# Patient Record
Sex: Male | Born: 1995 | Race: Black or African American | Hispanic: No | Smoking: Never smoker
Health system: Southern US, Community
[De-identification: ages and names within clinical notes are randomized; demographics above are authoritative.]

---
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
## 2015-04-03 ENCOUNTER — Encounter (HOSPITAL_COMMUNITY): Payer: Self-pay | Admitting: Emergency Medicine

## 2015-04-03 ENCOUNTER — Emergency Department (HOSPITAL_COMMUNITY)
Admission: EM | Admit: 2015-04-03 | Discharge: 2015-04-03 | Disposition: A | Discharge status: Home / Self Care | Payer: Medicaid Other | Attending: Emergency Medicine | Admitting: Emergency Medicine

## 2015-04-03 DIAGNOSIS — L259 Unspecified contact dermatitis, unspecified cause: Secondary | ICD-10-CM

## 2015-04-03 DIAGNOSIS — Z791 Long term (current) use of non-steroidal anti-inflammatories (NSAID): Secondary | ICD-10-CM | POA: Insufficient documentation

## 2015-04-03 DIAGNOSIS — J029 Acute pharyngitis, unspecified: Secondary | ICD-10-CM | POA: Insufficient documentation

## 2015-04-03 DIAGNOSIS — Z79899 Other long term (current) drug therapy: Secondary | ICD-10-CM | POA: Insufficient documentation

## 2015-04-03 MED ORDER — PREDNISONE 10 MG PO TABS: ORAL_TABLET | ORAL | Status: DC

## 2015-04-03 NOTE — Discharge Instructions (Signed)

## 2015-04-03 NOTE — ED Notes (Signed)
Verbalized understanding discharge instructions. In no acute distress.   

## 2015-04-03 NOTE — ED Notes (Signed)
Pt states that he has had a diffuse rash on his arms, groin and neck x 2 days. States when he was younger he used to have allergies to new fabrics. Has new sheets.

## 2015-04-03 NOTE — ED Provider Notes (Signed)
CSN: [id number]     Arrival date & time 04/03/15  1749 History  This chart was scribed for non-physician practitioner, Teressa LowerVrinda Erian Rosengren, NP, working with Elwin MochaBlair Walden, MD by Evon Slackerrance Branch, ED Scribe. This patient was seen in room WTR7/WTR7 and the patient's care was started at 5:58 PM.     Chief Complaint  Patient presents with  . Rash   The history is provided by the patient. No language interpreter was used.   HPI Comments: Mark Roth is a 19 y.o. male who presents to the Emergency Department complaining of generalized rash located on his arms, groin and neck onset 2 days prior. Pt does report new job in [employer]. Pt does report slight sore throat. Pt states that he has tried calamine lotion with no relief. Pt does report that when he was younger he was allergic to fabrics, he states that he recently just got some new sheets for his bed. Denies fever, new soaps or medications.   History reviewed. No pertinent past medical history. History reviewed. No pertinent past surgical history. History reviewed. No pertinent family history. History  Substance Use Topics  . Smoking status: Never Smoker   . Smokeless tobacco: Not on file  . Alcohol Use: No    Review of Systems  Constitutional: Negative for fever.  HENT: Positive for sore throat.   Skin: Positive for rash.  All other systems reviewed and are negative.     Allergies  Review of patient's allergies indicates no known allergies.  Home Medications   Prior to Admission medications   Medication Sig Start Date End Date Taking? Authorizing Provider  ibuprofen (ADVIL,MOTRIN) 800 MG tablet Take 1 tablet (800 mg total) by mouth 3 (three) times daily. 04/09/14   April Palumbo, MD  methocarbamol (ROBAXIN) 500 MG tablet Take 1 tablet (500 mg total) by mouth 2 (two) times daily. 04/09/14   April Palumbo, MD   BP 128/74 mmHg  Pulse 68  Temp(Src) 98.7 F (37.1 C) (Oral)  SpO2 98%   Physical Exam  Constitutional: He is  oriented to person, place, and time. He appears well-developed and well-nourished. No distress.  HENT:  Head: Normocephalic and atraumatic.  Right Ear: External ear normal.  Left Ear: External ear normal.  Mouth/Throat: Oropharynx is clear and moist.  Eyes: Conjunctivae and EOM are normal. Pupils are equal, round, and reactive to light.  Neck: Neck supple. No tracheal deviation present.  Cardiovascular: Normal rate.   Pulmonary/Chest: Effort normal. No respiratory distress.  Musculoskeletal: Normal range of motion.  Neurological: He is alert and oriented to person, place, and time. He exhibits normal muscle tone. Coordination normal.  Skin:  Papular rash to to trunk neck and upper extremity  Psychiatric: He has a normal mood and affect. His behavior is normal.  Nursing note and vitals reviewed.   ED Course  Procedures (including critical care time) DIAGNOSTIC STUDIES: Oxygen Saturation is 98% on RA, normal by my interpretation.    COORDINATION OF CARE: 6:06 PM-Discussed treatment plan with pt at bedside and pt agreed to plan.     Labs Review Labs Reviewed - No data to display  Imaging Review No results found.   EKG Interpretation None      MDM   Final diagnoses:  Contact dermatitis    Pt given steroid dose pack. Considered scarlett fever although doubt. Discussed return precautions  I personally performed the services described in this documentation, which was scribed in my presence. The recorded information has been reviewed and  is accurate.      Teressa Lower, NP 04/03/15 1815  Elwin Mocha, MD 04/03/15 2330

## 2017-03-10 DIAGNOSIS — K353 Acute appendicitis with localized peritonitis: Principal | ICD-10-CM | POA: Insufficient documentation

## 2017-03-11 ENCOUNTER — Encounter (HOSPITAL_COMMUNITY)
Admission: EM | Disposition: A | Discharge status: Home / Self Care | Payer: Self-pay | Source: Home / Self Care | Attending: Emergency Medicine

## 2017-03-11 ENCOUNTER — Emergency Department (HOSPITAL_COMMUNITY): Payer: Self-pay | Admitting: Anesthesiology

## 2017-03-11 ENCOUNTER — Encounter (HOSPITAL_COMMUNITY): Payer: Self-pay

## 2017-03-11 ENCOUNTER — Emergency Department (HOSPITAL_COMMUNITY): Payer: Self-pay

## 2017-03-11 ENCOUNTER — Observation Stay (HOSPITAL_COMMUNITY)
Admission: EM | Admit: 2017-03-11 | Discharge: 2017-03-11 | Disposition: A | Discharge status: Home / Self Care | Payer: Self-pay | Attending: Surgery | Admitting: Surgery

## 2017-03-11 DIAGNOSIS — K3580 Unspecified acute appendicitis: Secondary | ICD-10-CM | POA: Diagnosis present

## 2017-03-11 DIAGNOSIS — K353 Acute appendicitis with localized peritonitis, without perforation or gangrene: Secondary | ICD-10-CM

## 2017-03-11 HISTORY — PX: LAPAROSCOPIC APPENDECTOMY: SHX408

## 2017-03-11 LAB — COMPREHENSIVE METABOLIC PANEL
ALT: 18 U/L (ref 17–63)
ANION GAP: 10 (ref 5–15)
AST: 24 U/L (ref 15–41)
Albumin: 4.8 g/dL (ref 3.5–5.0)
Alkaline Phosphatase: 85 U/L (ref 38–126)
BUN: 11 mg/dL (ref 6–20)
CHLORIDE: 102 mmol/L (ref 101–111)
CO2: 24 mmol/L (ref 22–32)
Calcium: 9.6 mg/dL (ref 8.9–10.3)
Creatinine, Ser: 1.01 mg/dL (ref 0.61–1.24)
GFR calc Af Amer: >60 mL/min (ref >60)
Glucose, Bld: 98 mg/dL (ref 65–99)
Potassium: 3.9 mmol/L (ref 3.5–5.1)
Sodium: 136 mmol/L (ref 135–145)
TOTAL PROTEIN: 7.8 g/dL (ref 6.5–8.1)
Total Bilirubin: 1.1 mg/dL (ref 0.3–1.2)

## 2017-03-11 LAB — LIPASE, BLOOD: Lipase: 20 U/L (ref 11–51)

## 2017-03-11 LAB — URINALYSIS, ROUTINE W REFLEX MICROSCOPIC
BILIRUBIN URINE: NEGATIVE
Glucose, UA: NEGATIVE mg/dL
HGB URINE DIPSTICK: NEGATIVE
KETONES UR: 5 mg/dL — AB
Leukocytes, UA: NEGATIVE
NITRITE: NEGATIVE
PROTEIN: NEGATIVE mg/dL
Specific Gravity, Urine: 1.024 (ref 1.005–1.030)
pH: 6 (ref 5.0–8.0)

## 2017-03-11 LAB — CBC
HEMATOCRIT: 45.1 % (ref 39.0–52.0)
HEMOGLOBIN: 15.4 g/dL (ref 13.0–17.0)
MCH: 27.3 pg (ref 26.0–34.0)
MCHC: 34.1 g/dL (ref 30.0–36.0)
MCV: 80 fL (ref 78.0–100.0)
Platelets: 246 10*3/uL (ref 150–400)
RBC: 5.64 MIL/uL (ref 4.22–5.81)
RDW: 12.8 % (ref 11.5–15.5)
WBC: 12.2 10*3/uL — AB (ref 4.0–10.5)

## 2017-03-11 SURGERY — APPENDECTOMY, LAPAROSCOPIC
Anesthesia: General | Site: Abdomen

## 2017-03-11 MED ORDER — ONDANSETRON HCL 4 MG/2ML IJ SOLN
INTRAMUSCULAR | Status: AC
  Filled 2017-03-11: qty 2

## 2017-03-11 MED ORDER — IOPAMIDOL (ISOVUE-300) INJECTION 61%
INTRAVENOUS | Status: AC
  Administered 2017-03-11: 100 mL via INTRAVENOUS
  Filled 2017-03-11: qty 100

## 2017-03-11 MED ORDER — FENTANYL CITRATE (PF) 100 MCG/2ML IJ SOLN
50.0000 ug | Freq: Once | INTRAMUSCULAR | Status: AC
  Administered 2017-03-11: 50 ug via INTRAVENOUS
  Filled 2017-03-11: qty 2

## 2017-03-11 MED ORDER — SUGAMMADEX SODIUM 200 MG/2ML IV SOLN
INTRAVENOUS | Status: DC | PRN
  Administered 2017-03-11: 200 mg via INTRAVENOUS

## 2017-03-11 MED ORDER — METRONIDAZOLE IN NACL 5-0.79 MG/ML-% IV SOLN
500.0000 mg | Freq: Once | INTRAVENOUS | Status: AC
  Administered 2017-03-11: 500 mg via INTRAVENOUS
  Filled 2017-03-11: qty 100

## 2017-03-11 MED ORDER — DEXTROSE 5 % IV SOLN
2.0000 g | INTRAVENOUS | Status: DC
  Filled 2017-03-11: qty 2

## 2017-03-11 MED ORDER — PROPOFOL 10 MG/ML IV BOLUS
INTRAVENOUS | Status: DC | PRN
  Administered 2017-03-11: 150 mg via INTRAVENOUS

## 2017-03-11 MED ORDER — ROCURONIUM BROMIDE 10 MG/ML (PF) SYRINGE
PREFILLED_SYRINGE | INTRAVENOUS | Status: DC | PRN
  Administered 2017-03-11: 50 mg via INTRAVENOUS

## 2017-03-11 MED ORDER — PROPOFOL 10 MG/ML IV BOLUS
INTRAVENOUS | Status: AC
  Filled 2017-03-11: qty 20

## 2017-03-11 MED ORDER — FENTANYL CITRATE (PF) 250 MCG/5ML IJ SOLN
INTRAMUSCULAR | Status: DC | PRN
  Administered 2017-03-11 (×2): 50 ug via INTRAVENOUS
  Administered 2017-03-11: 100 ug via INTRAVENOUS

## 2017-03-11 MED ORDER — SUGAMMADEX SODIUM 200 MG/2ML IV SOLN
INTRAVENOUS | Status: AC
  Filled 2017-03-11: qty 2

## 2017-03-11 MED ORDER — OXYCODONE HCL 5 MG/5ML PO SOLN: 5.0000 mg | Freq: Once | ORAL | Status: DC | PRN

## 2017-03-11 MED ORDER — LIDOCAINE 2% (20 MG/ML) 5 ML SYRINGE
INTRAMUSCULAR | Status: DC | PRN
  Administered 2017-03-11: 60 mg via INTRAVENOUS

## 2017-03-11 MED ORDER — HYDROMORPHONE HCL 1 MG/ML IJ SOLN
0.5000 mg | INTRAMUSCULAR | Status: DC | PRN
  Administered 2017-03-11: 1 mg via INTRAVENOUS
  Filled 2017-03-11: qty 1

## 2017-03-11 MED ORDER — FENTANYL CITRATE (PF) 250 MCG/5ML IJ SOLN
INTRAMUSCULAR | Status: AC
  Filled 2017-03-11: qty 5

## 2017-03-11 MED ORDER — METRONIDAZOLE IN NACL 5-0.79 MG/ML-% IV SOLN
500.0000 mg | Freq: Three times a day (TID) | INTRAVENOUS | Status: DC
  Administered 2017-03-11: 15:00:00 500 mg via INTRAVENOUS
  Filled 2017-03-11 (×3): qty 100

## 2017-03-11 MED ORDER — ONDANSETRON HCL 4 MG/2ML IJ SOLN
4.0000 mg | Freq: Four times a day (QID) | INTRAMUSCULAR | Status: DC | PRN
  Administered 2017-03-11: 4 mg via INTRAVENOUS
  Filled 2017-03-11: qty 2

## 2017-03-11 MED ORDER — CEFTRIAXONE SODIUM 2 G IJ SOLR
2.0000 g | Freq: Once | INTRAMUSCULAR | Status: AC
  Administered 2017-03-11: 2 g via INTRAVENOUS
  Filled 2017-03-11: qty 2

## 2017-03-11 MED ORDER — LACTATED RINGERS IV SOLN
INTRAVENOUS | Status: DC | PRN
  Administered 2017-03-11 (×2): via INTRAVENOUS

## 2017-03-11 MED ORDER — HYDROCODONE-ACETAMINOPHEN 5-325 MG PO TABS
1.0000 | ORAL_TABLET | ORAL | Status: DC | PRN
  Administered 2017-03-11: 19:00:00 1 via ORAL
  Filled 2017-03-11: qty 1

## 2017-03-11 MED ORDER — LACTATED RINGERS IR SOLN
Status: DC | PRN
  Administered 2017-03-11: 1000 mL

## 2017-03-11 MED ORDER — ONDANSETRON HCL 4 MG/2ML IJ SOLN
INTRAMUSCULAR | Status: DC | PRN
  Administered 2017-03-11: 4 mg via INTRAVENOUS

## 2017-03-11 MED ORDER — ONDANSETRON HCL 4 MG/2ML IJ SOLN: 4.0000 mg | Freq: Four times a day (QID) | INTRAMUSCULAR | Status: DC | PRN

## 2017-03-11 MED ORDER — MIDAZOLAM HCL 2 MG/2ML IJ SOLN
INTRAMUSCULAR | Status: AC
Start: 2017-03-11 — End: 2017-03-11
  Filled 2017-03-11: qty 2

## 2017-03-11 MED ORDER — ONDANSETRON 4 MG PO TBDP: 4.0000 mg | ORAL_TABLET | Freq: Four times a day (QID) | ORAL | Status: DC | PRN

## 2017-03-11 MED ORDER — DEXAMETHASONE SODIUM PHOSPHATE 10 MG/ML IJ SOLN
INTRAMUSCULAR | Status: AC
  Filled 2017-03-11: qty 1

## 2017-03-11 MED ORDER — KCL IN DEXTROSE-NACL 20-5-0.45 MEQ/L-%-% IV SOLN
INTRAVENOUS | Status: DC
  Administered 2017-03-11: 100 mL/h via INTRAVENOUS
  Filled 2017-03-11 (×2): qty 1000

## 2017-03-11 MED ORDER — PROMETHAZINE HCL 25 MG/ML IJ SOLN: 6.2500 mg | INTRAMUSCULAR | Status: DC | PRN

## 2017-03-11 MED ORDER — KETOROLAC TROMETHAMINE 30 MG/ML IJ SOLN
INTRAMUSCULAR | Status: AC
  Filled 2017-03-11: qty 1

## 2017-03-11 MED ORDER — LIDOCAINE 2% (20 MG/ML) 5 ML SYRINGE
INTRAMUSCULAR | Status: AC
  Filled 2017-03-11: qty 5

## 2017-03-11 MED ORDER — OXYCODONE HCL 5 MG PO TABS: 5.0000 mg | ORAL_TABLET | Freq: Once | ORAL | Status: DC | PRN

## 2017-03-11 MED ORDER — 0.9 % SODIUM CHLORIDE (POUR BTL) OPTIME
TOPICAL | Status: DC | PRN
  Administered 2017-03-11: 1000 mL

## 2017-03-11 MED ORDER — HYDROCODONE-ACETAMINOPHEN 5-325 MG PO TABS: 1.0000 | ORAL_TABLET | ORAL | 0 refills | Status: AC | PRN

## 2017-03-11 MED ORDER — IBUPROFEN 400 MG PO TABS
400.0000 mg | ORAL_TABLET | Freq: Four times a day (QID) | ORAL | Status: DC | PRN
  Administered 2017-03-11: 19:00:00 400 mg via ORAL
  Filled 2017-03-11: qty 1

## 2017-03-11 MED ORDER — BUPIVACAINE-EPINEPHRINE 0.25% -1:200000 IJ SOLN
INTRAMUSCULAR | Status: DC | PRN
  Administered 2017-03-11: 30 mL

## 2017-03-11 MED ORDER — ROCURONIUM BROMIDE 50 MG/5ML IV SOSY
PREFILLED_SYRINGE | INTRAVENOUS | Status: AC
  Filled 2017-03-11: qty 5

## 2017-03-11 MED ORDER — ACETAMINOPHEN 325 MG PO TABS: ORAL_TABLET | ORAL | 2 refills | Status: AC

## 2017-03-11 MED ORDER — FENTANYL CITRATE (PF) 100 MCG/2ML IJ SOLN: 25.0000 ug | INTRAMUSCULAR | Status: DC | PRN

## 2017-03-11 MED ORDER — CEFTRIAXONE SODIUM 2 G IJ SOLR: 2.0000 g | Freq: Once | INTRAMUSCULAR | Status: DC

## 2017-03-11 MED ORDER — METRONIDAZOLE IN NACL 5-0.79 MG/ML-% IV SOLN: 500.0000 mg | Freq: Once | INTRAVENOUS | Status: DC

## 2017-03-11 MED ORDER — KETOROLAC TROMETHAMINE 30 MG/ML IJ SOLN
30.0000 mg | Freq: Once | INTRAMUSCULAR | Status: DC | PRN
  Administered 2017-03-11: 30 mg via INTRAVENOUS

## 2017-03-11 MED ORDER — MORPHINE SULFATE (PF) 4 MG/ML IV SOLN: 1.0000 mg | INTRAVENOUS | Status: DC | PRN

## 2017-03-11 MED ORDER — ONDANSETRON HCL 4 MG/2ML IJ SOLN
4.0000 mg | Freq: Once | INTRAMUSCULAR | Status: AC
  Administered 2017-03-11: 4 mg via INTRAVENOUS
  Filled 2017-03-11: qty 2

## 2017-03-11 MED ORDER — MIDAZOLAM HCL 5 MG/5ML IJ SOLN
INTRAMUSCULAR | Status: DC | PRN
  Administered 2017-03-11: 2 mg via INTRAVENOUS

## 2017-03-11 MED ORDER — DEXAMETHASONE SODIUM PHOSPHATE 10 MG/ML IJ SOLN
INTRAMUSCULAR | Status: DC | PRN
  Administered 2017-03-11: 10 mg via INTRAVENOUS

## 2017-03-11 MED ORDER — IOPAMIDOL (ISOVUE-300) INJECTION 61%
100.0000 mL | Freq: Once | INTRAVENOUS | Status: AC | PRN
  Administered 2017-03-11: 100 mL via INTRAVENOUS

## 2017-03-11 MED ORDER — HEPARIN SODIUM (PORCINE) 5000 UNIT/ML IJ SOLN: 5000.0000 [IU] | Freq: Three times a day (TID) | INTRAMUSCULAR | Status: DC

## 2017-03-11 MED ORDER — BUPIVACAINE-EPINEPHRINE (PF) 0.25% -1:200000 IJ SOLN
INTRAMUSCULAR | Status: AC
  Filled 2017-03-11: qty 30

## 2017-03-11 SURGICAL SUPPLY — 39 items
ADH SKN CLS APL DERMABOND .7 (GAUZE/BANDAGES/DRESSINGS) ×1
APL SKNCLS STERI-STRIP NONHPOA (GAUZE/BANDAGES/DRESSINGS)
APPLIER CLIP ROT 10 11.4 M/L (STAPLE)
APR CLP MED LRG 11.4X10 (STAPLE)
BAG SPEC RTRVL LRG 6X4 10 (ENDOMECHANICALS) ×1
BENZOIN TINCTURE PRP APPL 2/3 (GAUZE/BANDAGES/DRESSINGS) IMPLANT
CABLE HIGH FREQUENCY MONO STRZ (ELECTRODE) ×2 IMPLANT
CHLORAPREP W/TINT 26ML (MISCELLANEOUS) ×2 IMPLANT
CLIP APPLIE ROT 10 11.4 M/L (STAPLE) IMPLANT
COVER SURGICAL LIGHT HANDLE (MISCELLANEOUS) ×2 IMPLANT
CUTTER FLEX LINEAR 45M (STAPLE) ×1 IMPLANT
DECANTER SPIKE VIAL GLASS SM (MISCELLANEOUS) ×2 IMPLANT
DERMABOND ADVANCED (GAUZE/BANDAGES/DRESSINGS) ×1
DERMABOND ADVANCED .7 DNX12 (GAUZE/BANDAGES/DRESSINGS) ×1 IMPLANT
DRAPE LAPAROSCOPIC ABDOMINAL (DRAPES) ×2 IMPLANT
ELECT REM PT RETURN 15FT ADLT (MISCELLANEOUS) ×2 IMPLANT
ENDOLOOP SUT PDS II  0 18 (SUTURE)
ENDOLOOP SUT PDS II 0 18 (SUTURE) IMPLANT
GLOVE SURG SIGNA 7.5 PF LTX (GLOVE) ×2 IMPLANT
GOWN STRL REUS W/TWL XL LVL3 (GOWN DISPOSABLE) ×4 IMPLANT
IRRIG SUCT STRYKERFLOW 2 WTIP (MISCELLANEOUS) ×2
IRRIGATION SUCT STRKRFLW 2 WTP (MISCELLANEOUS) ×1 IMPLANT
KIT BASIN OR (CUSTOM PROCEDURE TRAY) ×2 IMPLANT
POUCH SPECIMEN RETRIEVAL 10MM (ENDOMECHANICALS) ×2 IMPLANT
RELOAD 45 VASCULAR/THIN (ENDOMECHANICALS) IMPLANT
RELOAD STAPLE 45 2.5 WHT GRN (ENDOMECHANICALS) IMPLANT
RELOAD STAPLE 45 3.5 BLU ETS (ENDOMECHANICALS) IMPLANT
RELOAD STAPLE TA45 3.5 REG BLU (ENDOMECHANICALS) ×2 IMPLANT
SCISSORS LAP 5X35 DISP (ENDOMECHANICALS) ×2 IMPLANT
SHEARS HARMONIC ACE PLUS 36CM (ENDOMECHANICALS) ×2 IMPLANT
SLEEVE XCEL OPT CAN 5 100 (ENDOMECHANICALS) ×2 IMPLANT
STRIP CLOSURE SKIN 1/2X4 (GAUZE/BANDAGES/DRESSINGS) IMPLANT
SUT MNCRL AB 4-0 PS2 18 (SUTURE) ×2 IMPLANT
SUT VIC AB 2-0 SH 18 (SUTURE) IMPLANT
TOWEL OR 17X26 10 PK STRL BLUE (TOWEL DISPOSABLE) ×2 IMPLANT
TOWEL OR NON WOVEN STRL DISP B (DISPOSABLE) ×2 IMPLANT
TRAY LAPAROSCOPIC (CUSTOM PROCEDURE TRAY) ×2 IMPLANT
TROCAR BLADELESS OPT 5 100 (ENDOMECHANICALS) ×2 IMPLANT
TROCAR XCEL BLUNT TIP 100MML (ENDOMECHANICALS) ×2 IMPLANT

## 2017-03-11 NOTE — H&P (Signed)
[facility] Admission Note  Mark Roth 1996/08/22  [id number].    Requesting MD: Ripley Fraise Chief Complaint/Reason for Consult: Acute Appendicitis HPI:  Patient is a 21 y.o. Male who presented to [facility] with lower abdominal pain since Sunday night. Pain has been constant and stabbing in nature, localized to the RLQ yesterday. Associated nausea. Denies vomiting, diarrhea, fever, difficulty urinating. Pain is exacerbated with movement, nothing alleviates. No significant PMH, takes no daily medications. Patient has not had any past surgeries. Denies tobacco use, alcohol use, any illicit drug use.   ROS: Review of Systems  Constitutional: Negative for chills and fever.  Respiratory: Negative for shortness of breath and wheezing.   Cardiovascular: Negative for chest pain and palpitations.  Gastrointestinal: Positive for abdominal pain and nausea. Negative for blood in stool, diarrhea and vomiting.  Genitourinary: Negative for dysuria and flank pain.  All other systems reviewed and are negative.   History reviewed. No pertinent family history.  History reviewed. No pertinent past medical history.  History reviewed. No pertinent surgical history.  Social History:  reports that he has never smoked. He has never used smokeless tobacco. He reports that he does not drink alcohol or use drugs.  Allergies: No Known Allergies   (Not in a hospital admission)  Blood pressure 114/63, pulse 67, temperature 98.7 F (37.1 C), temperature source Oral, resp. rate 18, height 6' (1.829 m), weight 72.6 kg (160 lb), SpO2 98 %. Physical Exam: Physical Exam  Constitutional: He is oriented to person, place, and time. He appears well-developed and well-nourished. He is cooperative.  Non-toxic appearance. No distress.  HENT:  Head: Normocephalic and atraumatic.  Right Ear: Hearing and external ear normal.  Left Ear: Hearing and external ear normal.  Nose: Nose normal.  Mouth/Throat:  Oropharynx is clear and moist and mucous membranes are normal.  Eyes: Conjunctivae, EOM and lids are normal. Pupils are equal, round, and reactive to light. No scleral icterus.  Neck: Trachea normal, normal range of motion and phonation normal. Neck supple.  Cardiovascular: Normal rate, regular rhythm, S1 normal and S2 normal.   Pulses:      Radial pulses are 2+ on the right side, and 2+ on the left side.       Dorsalis pedis pulses are 2+ on the right side, and 2+ on the left side.  Pulmonary/Chest: Effort normal and breath sounds normal. He has no decreased breath sounds. He has no wheezes. He has no rhonchi. He has no rales.  Abdominal: Soft. Normal appearance. He exhibits no distension. Bowel sounds are decreased. There is tenderness in the right lower quadrant. There is rebound and tenderness at McBurney's point. There is no rigidity and no guarding. No hernia.  Musculoskeletal:  No gross deformities, no edema. Patient moving all extremities.   Neurological: He is alert and oriented to person, place, and time. He has normal strength. No sensory deficit.  Skin: Skin is warm, dry and intact. No rash noted. He is not diaphoretic.  Psychiatric: He has a normal mood and affect. His behavior is normal. Judgment and thought content normal.    Results for orders placed or performed during the hospital encounter of 03/11/17 (from the past 48 hour(s))  Urinalysis, Routine w reflex microscopic     Status: Abnormal   Collection Time: 03/11/17  1:14 AM  Result Value Ref Range   Color, Urine YELLOW YELLOW   APPearance CLEAR CLEAR   Specific Gravity, Urine 1.024 1.005 - 1.030   pH 6.0  5.0 - 8.0   Glucose, UA NEGATIVE NEGATIVE mg/dL   Hgb urine dipstick NEGATIVE NEGATIVE   Bilirubin Urine NEGATIVE NEGATIVE   Ketones, ur 5 (A) NEGATIVE mg/dL   Protein, ur NEGATIVE NEGATIVE mg/dL   Nitrite NEGATIVE NEGATIVE   Leukocytes, UA NEGATIVE NEGATIVE  Lipase, blood     Status: None   Collection Time:  03/11/17  1:38 AM  Result Value Ref Range   Lipase 20 11 - 51 U/L  Comprehensive metabolic panel     Status: None   Collection Time: 03/11/17  1:38 AM  Result Value Ref Range   Sodium 136 135 - 145 mmol/L   Potassium 3.9 3.5 - 5.1 mmol/L   Chloride 102 101 - 111 mmol/L   CO2 24 22 - 32 mmol/L   Glucose, Bld 98 65 - 99 mg/dL   BUN 11 6 - 20 mg/dL   Creatinine, Ser 1.01 0.61 - 1.24 mg/dL   Calcium 9.6 8.9 - 10.3 mg/dL   Total Protein 7.8 6.5 - 8.1 g/dL   Albumin 4.8 3.5 - 5.0 g/dL   AST 24 15 - 41 U/L   ALT 18 17 - 63 U/L   Alkaline Phosphatase 85 38 - 126 U/L   Total Bilirubin 1.1 0.3 - 1.2 mg/dL   GFR calc non Af Amer >60 >60 mL/min   GFR calc Af Amer >60 >60 mL/min    Comment: (NOTE) The eGFR has been calculated using the CKD EPI equation. This calculation has not been validated in all clinical situations. eGFR's persistently <60 mL/min signify possible Chronic Kidney Disease.    Anion gap 10 5 - 15  CBC     Status: Abnormal   Collection Time: 03/11/17  1:38 AM  Result Value Ref Range   WBC 12.2 (H) 4.0 - 10.5 K/uL   RBC 5.64 4.22 - 5.81 MIL/uL   Hemoglobin 15.4 13.0 - 17.0 g/dL   HCT 45.1 39.0 - 52.0 %   MCV 80.0 78.0 - 100.0 fL   MCH 27.3 26.0 - 34.0 pg   MCHC 34.1 30.0 - 36.0 g/dL   RDW 12.8 11.5 - 15.5 %   Platelets 246 150 - 400 K/uL   Ct Abdomen Pelvis W Contrast  Result Date: 03/11/2017 CLINICAL DATA:  Right lower quadrant pain for 1 day. EXAM: CT ABDOMEN AND PELVIS WITH CONTRAST TECHNIQUE: Multidetector CT imaging of the abdomen and pelvis was performed using the standard protocol following bolus administration of intravenous contrast. CONTRAST:  100 cc Isovue-300 IV COMPARISON:  None. FINDINGS: Lower chest: The lung bases are clear. Hepatobiliary: No focal liver abnormality is seen. No gallstones, gallbladder wall thickening, or biliary dilatation. Pancreas: No ductal dilatation or inflammation. Spleen: Normal in size without focal abnormality. Adrenals/Urinary  Tract: Adrenal glands are unremarkable. Kidneys are normal, without renal calculi, focal lesion, or hydronephrosis. Bladder is unremarkable. Stomach/Bowel: The appendix is dilated measuring 13 mm with wall thickening, peripheral enhancement sign intraluminal fluid. Moderate surrounding periappendiceal soft tissue stranding and mild free fluid. No perforation or abscess. Minimal wall thickening of pelvic bowel loops likely reactive. Stomach is physiologically distended. Vascular/Lymphatic: No significant vascular findings are present. No enlarged abdominal or pelvic lymph nodes. Reproductive: Prostate is unremarkable. Other: Small-moderate volume free fluid in the pelvis is minimally complex, likely reactive. No loculated abscess. Musculoskeletal: There are no acute or suspicious osseous abnormalities. IMPRESSION: Uncomplicated acute appendicitis. Small to moderate free fluid in the pelvis is likely reactive. No loculated abscess or perforation. Electronically Signed  By: Jeb Levering M.D.   On: 03/11/2017 05:28      Assessment/Plan Acute Appendicitis - to OR for laparoscopic appendectomy with Dr. Lucia Gaskins - consent patient - IV rocephin and IV flagyl  - NPO, IVF, pain control, antiemetics PRN for nausea - admit to floor post-op   Brigid Re, [facility] Surgery 03/11/2017, 7:20 AM Pager: [phone] Consults: [phone] Mon-Fri 7:00 am-4:30 pm Sat-Sun 7:00 am-11:30 am  Agree with above. Girlfriend, Ky Barban, at bedside.  His mother is going to come to the hospital.  I discussed with the patient the indications and risks of appendiceal surgery.  The primary risks of appendiceal surgery include, but are not limited to, bleeding, infection, bowel surgery, and open surgery.  There is also the risk that the patient may have continued symptoms after surgery.  We discussed the typical post-operative recovery course. I tried to answer the patient's questions.  Will proceed  with lap appendectomy later this AM.  Alphonsa Overall, MD, [facility] Surgery Pager: [phone] Office phone:  [phone]

## 2017-03-11 NOTE — Anesthesia Postprocedure Evaluation (Signed)
Anesthesia Post Note  Patient: Mark Roth  Procedure(s) Performed: Procedure(s) (LRB): APPENDECTOMY LAPAROSCOPIC (N/A)     Patient location during evaluation: PACU Anesthesia Type: General Level of consciousness: awake and alert Pain management: pain level controlled Vital Signs Assessment: post-procedure vital signs reviewed and stable Respiratory status: spontaneous breathing, nonlabored ventilation, respiratory function stable and patient connected to nasal cannula oxygen Cardiovascular status: stable and blood pressure returned to baseline Postop Assessment: no signs of nausea or vomiting Anesthetic complications: no    Last Vitals:  Vitals:   03/11/17 1100 03/11/17 1115  BP: 138/66 125/61  Pulse: 82 78  Resp: 14 12  Temp:  37.1 C    Last Pain:  Vitals:   03/11/17 1115  TempSrc:   PainSc: Asleep                 Corde Antonini S

## 2017-03-11 NOTE — Transfer of Care (Signed)
Immediate Anesthesia Transfer of Care Note  Patient: Mark Roth  Procedure(s) Performed: Procedure(s): APPENDECTOMY LAPAROSCOPIC (N/A)  Patient Location: PACU  Anesthesia Type:General  Level of Consciousness:  sedated, patient cooperative and responds to stimulation  Airway & Oxygen Therapy:Patient Spontanous Breathing and Patient connected to face mask oxgen  Post-op Assessment:  Report given to PACU RN and Post -op Vital signs reviewed and stable  Post vital signs:  Reviewed and stable  Last Vitals:  Vitals:   03/11/17 0533 03/11/17 0641  BP: 125/66 114/63  Pulse: 71 67  Resp: 18 18  Temp:  37.1 C    Complications: No apparent anesthesia complications

## 2017-03-11 NOTE — Anesthesia Preprocedure Evaluation (Signed)
Anesthesia Evaluation  Patient identified by MRN, date of birth, ID band Patient awake    Reviewed: Allergy & Precautions, NPO status , Patient's Chart, lab work & pertinent test results  Airway Mallampati: II  TM Distance: >3 FB Neck ROM: Full    Dental no notable dental hx.    Pulmonary neg pulmonary ROS,    Pulmonary exam normal breath sounds clear to auscultation       Cardiovascular negative cardio ROS Normal cardiovascular exam Rhythm:Regular Rate:Normal     Neuro/Psych negative neurological ROS  negative psych ROS   GI/Hepatic negative GI ROS, Neg liver ROS,   Endo/Other  negative endocrine ROS  Renal/GU negative Renal ROS  negative genitourinary   Musculoskeletal negative musculoskeletal ROS (+)   Abdominal   Peds negative pediatric ROS (+)  Hematology negative hematology ROS (+)   Anesthesia Other Findings   Reproductive/Obstetrics negative OB ROS                             Anesthesia Physical Anesthesia Plan  ASA: I  Anesthesia Plan: General   Post-op Pain Management:    Induction: Intravenous  PONV Risk Score and Plan: 1 and Ondansetron and Dexamethasone  Airway Management Planned: Oral ETT  Additional Equipment:   Intra-op Plan:   Post-operative Plan: Extubation in OR  Informed Consent: I have reviewed the patients History and Physical, chart, labs and discussed the procedure including the risks, benefits and alternatives for the proposed anesthesia with the patient or authorized representative who has indicated his/her understanding and acceptance.     Dental advisory given  Plan Discussed with: CRNA and Surgeon  Anesthesia Plan Comments:         Anesthesia Quick Evaluation  

## 2017-03-11 NOTE — Op Note (Signed)
Re:   Mark Roth DOB:   [birth year] MRN:   [id number]                   FACILITY:  [facility]  DATE OF PROCEDURE: 03/11/2017                              OPERATIVE REPORT  PREOPERATIVE DIAGNOSIS:  Appendicitis  POSTOPERATIVE DIAGNOSIS:  Acute purulent appendicitis.  PROCEDURE:  Laparoscopic appendectomy.  SURGEON:  Sandria Bales. Ezzard Standing, MD  ASSISTANT:  No first assistant.  ANESTHESIA:  General endotracheal.  Anesthesiologist: Eilene Ghazi, MD CRNA: Jhonnie Garner, CRNA  ASA:  1E  ESTIMATED BLOOD LOSS:  Minimal.  DRAINS: none   SPECIMEN:   Appendix  COUNTS CORRECT:  YES  INDICATIONS FOR PROCEDURE: Mark Roth is a 21 y.o. (DOB: 02-Jun-1996) AA male whose primary care doctor is Patient, No Pcp Per and comes to the OR for an appendectomy.   I discussed with the patient, the indications and potential complications of appendiceal surgery.  The potential complications include, but are not limited to, bleeding, open surgery, bowel resection, and the possibility of another diagnosis.  OPERATIVE NOTE:  The patient underwent a general endotracheal anesthetic as supervised by Anesthesiologist: Eilene Ghazi, MD CRNA: Jhonnie Garner, CRNA, General, in room #4 at [facility]  The patient was given Rocephin and Flagyl prior to the beginning of the procedure and the abdomen was prepped with ChloraPrep.   A time-out was held and surgical checklist run.  An infraumbilical incision was made with sharp dissection carried down to the abdominal cavity.  An 12 mm Hasson trocar was inserted through the infraumbilical incision and into the peritoneal cavity.  A 0 degree 5 mm laparoscope was inserted through a 12 mm Hasson trocar and the Hasson trocar secured with a 0 Vicryl suture.  I placed a 5 mm trocar in the right upper quadrant and 5 mm torcar in left lower quadrant and did abdominal exploration.    The right and left lobes of liver unremarkable.  Stomach was unremarkable.  The pelvic organs  were unremarkable.  I saw no other intra-abdominal abnormality.  The patient had appendicitis with the appendix located lateral to the cecum and right colon.  The appendix was covered under a veil of peritoneum and, at the tip, was bulbous and covered with pus..  The mesentery of the appendix was divided with a Harmonic scalpel.  I got to the base of the appendix.  I then used a blue load 45 mm Ethicon Endo-GIA stapler and fired this across the base of the appendix.  I placed the appendix in EndoCatch bag and delivered the bag through the umbilical incision.  I irrigated the abdomen with 1,000 cc of saline.  After irrigating the abdomen, I then removed the trocars, in turn.  The umbilical port fascia was closed with 0 Vicryl suture.   I closed the skin each site with a 4-0 Monocryl suture and painted the wounds with DermaBond.  I then injected a total of 30 mL of 0.25% Marcaine at the incisions.  Sponge and needle count were correct at the end of the case.  The patient was transferred to the recovery room in good condition.  The patient tolerated the procedure well and it depends on the patient's post op clinical course as to when the patient could be discharged.   Ovidio Kin, MD, [facility]  [place] [facility] Pager: [phone] Office phone:  [phone]

## 2017-03-11 NOTE — Discharge Instructions (Signed)
Laparoscopic Appendectomy, Adult, Care After °These instructions give you information about caring for yourself after your procedure. Your doctor may also give you more specific instructions. Call your doctor if you have any problems or questions after your procedure. °Follow these instructions at home: °Medicines °· Take over-the-counter and prescription medicines only as told by your doctor. °· Do not drive for 24 hours if you received a sedative. °· Do not drive or use heavy machinery while taking prescription pain medicine. °· If you were prescribed an antibiotic medicine, take it as told by your doctor. Do not stop taking it even if you start to feel better. °Activity °· Do not lift anything that is heavier than 10 pounds (4.5 kg) for 3 weeks or as told by your doctor. °· Do not play contact sports for 3 weeks or as told by your doctor. °· Slowly return to your normal activities. °Bathing °· Keep your cuts from surgery (incisions) clean and dry. °? Gently wash the cuts with soap and water. °? Rinse the cuts with water until the soap is gone. °? Pat the cuts dry with a clean towel. Do not rub the cuts. °· You may take showers after 48 hours. °· Do not take baths, swim, or use a hot tub for 2 weeks or as told by your doctor. °Cut Care °· Follow instructions from your doctor about how to take care of your cuts. Make sure you: °? Wash your hands with soap and water before you change your bandage (dressing). If you do not have soap and water, use hand sanitizer. °? Change your bandage as told by your doctor. °? Leave stitches (sutures), skin glue, or skin tape (adhesive) strips in place. They may need to stay in place for 2 weeks or longer. If tape strips get loose and curl up, you may trim the loose edges. Do not remove tape strips completely unless your doctor says it is okay. °· Check your cuts every day for signs of infection. Check for: °? More redness, swelling, or pain. °? More fluid or  blood. °? Warmth. °? Pus or a bad smell. °Other Instructions °· If you were sent home with a drain, follow instructions from your doctor about how to use it and care for it. °· Take deep breaths. This helps to keep your lungs from getting swollen (inflamed). °· To help with constipation: °? Drink plenty of fluids. °? Eat plenty of fruits and vegetables. °· Keep all follow-up visits as told by your doctor. This is important. °Contact a doctor if: °· You have more redness, swelling, or pain around a cut from surgery. °· You have more fluid or blood coming from a cut. °· Your cut feels warm to the touch. °· You have pus or a bad smell coming from a cut or a bandage. °· The edges of a cut break open after the stitches have been taken out. °· You have pain in your shoulders that gets worse. °· You feel dizzy or you pass out (faint). °· You have shortness of breath. °· You keep feeling sick to your stomach (nauseous). °· You keep throwing up (vomiting). °· You get diarrhea or you cannot control your poop. °· You lose your appetite. °· You have swelling or pain in your legs. °Get help right away if: °· You have a fever. °· You get a rash. °· You have trouble breathing. °· You have sharp pains in your chest. °This information is not intended to replace advice given   to you by your health care provider. Make sure you discuss any questions you have with your health care provider. °Document Released: 07/06/2009 Document Revised: 02/15/2016 Document Reviewed: 02/27/2015 °Elsevier Interactive Patient Education © 2018 [employer] ° °CCS ______[facility] °LAPAROSCOPIC SURGERY: POST OP INSTRUCTIONS °Always review your discharge instruction sheet given to you by the facility where your surgery was performed. °IF YOU HAVE DISABILITY OR FAMILY LEAVE FORMS, YOU MUST BRING THEM TO THE OFFICE FOR PROCESSING.   °DO NOT GIVE THEM TO YOUR DOCTOR. ° °1. A prescription for pain medication may be given to you upon  discharge.  Take your pain medication as prescribed, if needed.  If narcotic pain medicine is not needed, then you may take acetaminophen (Tylenol) or ibuprofen (Advil) as needed. °2. Take your usually prescribed medications unless otherwise directed. °3. If you need a refill on your pain medication, please contact your pharmacy.  They will contact our office to request authorization. Prescriptions will not be filled after 5pm or on week-ends. °4. You should follow a light diet the first few days after arrival home, such as soup and crackers, etc.  Be sure to include lots of fluids daily. °5. Most patients will experience some swelling and bruising in the area of the incisions.  Ice packs will help.  Swelling and bruising can take several days to resolve.  °6. It is common to experience some constipation if taking pain medication after surgery.  Increasing fluid intake and taking a stool softener (such as Colace) will usually help or prevent this problem from occurring.  A mild laxative (Milk of Magnesia or Miralax) should be taken according to package instructions if there are no bowel movements after 48 hours. °7. Unless discharge instructions indicate otherwise, you may remove your bandages 24-48 hours after surgery, and you may shower at that time.  You may have steri-strips (small skin tapes) in place directly over the incision.  These strips should be left on the skin for 7-10 days.  If your surgeon used skin glue on the incision, you may shower in 24 hours.  The glue will flake off over the next 2-3 weeks.  Any sutures or staples will be removed at the office during your follow-up visit. °8. ACTIVITIES:  You may resume regular (light) daily activities beginning the next day--such as daily self-care, walking, climbing stairs--gradually increasing activities as tolerated.  You may have sexual intercourse when it is comfortable.  Refrain from any heavy lifting or straining until approved by your doctor. °a. You  may drive when you are no longer taking prescription pain medication, you can comfortably wear a seatbelt, and you can safely maneuver your car and apply brakes. °b. RETURN TO WORK:  __________________________________________________________ °9. You should see your doctor in the office for a follow-up appointment approximately 2-3 weeks after your surgery.  Make sure that you call for this appointment within a day or two after you arrive home to insure a convenient appointment time. °10. OTHER INSTRUCTIONS: __________________________________________________________________________________________________________________________ __________________________________________________________________________________________________________________________ °WHEN TO CALL YOUR DOCTOR: °1. Fever over 101.0 °2. Inability to urinate °3. Continued bleeding from incision. °4. Increased pain, redness, or drainage from the incision. °5. Increasing abdominal pain ° °The clinic staff is available to answer your questions during regular business hours.  Please don’t hesitate to call and ask to speak to one of the nurses for clinical concerns.  If you have a medical emergency, go to the nearest emergency room or call 911.  A surgeon from   [facility] is always on call at the hospital. °[address], [address], [place], [place]  [address] ? [address] [address], [place], [place]   [address] °[phone] ? [phone] ? FAX [phone] °Web site: [email or web address] ° °

## 2017-03-11 NOTE — ED Provider Notes (Signed)
[facility]-EMERGENCY DEPT Provider Note   CSN: [id number] Arrival date & time: 03/10/17  2358     History   Chief Complaint Chief Complaint  Patient presents with  . Abdominal Pain    HPI Tiquan B Crail is a 21 y.o. male.  The history is provided by the patient.  Abdominal Pain   This is a new problem. The current episode started 12 to 24 hours ago. The problem occurs constantly. The problem has been gradually worsening. The pain is located in the RLQ. The pain is severe. Associated symptoms include nausea. Pertinent negatives include fever, diarrhea, vomiting and dysuria. The symptoms are aggravated by certain positions and palpation. Nothing relieves the symptoms.     PMH -none Home Medications    Prior to Admission medications   Medication Sig Start Date End Date Taking? Authorizing Provider  ibuprofen (ADVIL,MOTRIN) 800 MG tablet Take 1 tablet (800 mg total) by mouth 3 (three) times daily. 04/09/14   Palumbo, April, MD  methocarbamol (ROBAXIN) 500 MG tablet Take 1 tablet (500 mg total) by mouth 2 (two) times daily. 04/09/14   Palumbo, April, MD  predniSONE (DELTASONE) 10 MG tablet 12 day stepdown dose 04/03/15   Teressa Lower, NP    Family History History reviewed. No pertinent family history.  Social History Social History  Substance Use Topics  . Smoking status: Never Smoker  . Smokeless tobacco: Never Used  . Alcohol use No     Allergies   Patient has no known allergies.   Review of Systems Review of Systems  Constitutional: Negative for fever.  Gastrointestinal: Positive for abdominal pain and nausea. Negative for diarrhea and vomiting.  Genitourinary: Negative for dysuria and testicular pain.  All other systems reviewed and are negative.    Physical Exam Updated Vital Signs BP (!) 123/53 (BP Location: Right Arm)   Pulse (!) 110   Temp 98.8 F (37.1 C) (Oral)   Resp 18   Ht 1.829 m (6')   Wt 72.6 kg (160 lb)   SpO2 100%   BMI 21.70 kg/m    Physical Exam CONSTITUTIONAL: Well developed/well nourished HEAD: Normocephalic/atraumatic EYES: EOMI/PERRL ENMT: Mucous membranes moist NECK: supple no meningeal signs SPINE/BACK:entire spine nontender CV: S1/S2 noted, no murmurs/rubs/gallops noted LUNGS: Lungs are clear to auscultation bilaterally, no apparent distress ABDOMEN: soft, moderate RLQ tenderness, no rebound or guarding, bowel sounds noted throughout abdomen GU:no cva tenderness NEURO: Pt is awake/alert/appropriate, moves all extremitiesx4.  No facial droop.   EXTREMITIES: pulses normal/equal, full ROM SKIN: warm, color normal PSYCH: no abnormalities of mood noted, alert and oriented to situation   ED Treatments / Results  Labs (all labs ordered are listed, but only abnormal results are displayed) Labs Reviewed  CBC - Abnormal; Notable for the following:       Result Value   WBC 12.2 (*)    All other components within normal limits  URINALYSIS, ROUTINE W REFLEX MICROSCOPIC - Abnormal; Notable for the following:    Ketones, ur 5 (*)    All other components within normal limits  LIPASE, BLOOD  COMPREHENSIVE METABOLIC PANEL    EKG  EKG Interpretation None       Radiology Ct Abdomen Pelvis W Contrast  Result Date: 03/11/2017 CLINICAL DATA:  Right lower quadrant pain for 1 day. EXAM: CT ABDOMEN AND PELVIS WITH CONTRAST TECHNIQUE: Multidetector CT imaging of the abdomen and pelvis was performed using the standard protocol following bolus administration of intravenous contrast. CONTRAST:  100 cc Isovue-300 IV COMPARISON:  None. FINDINGS: Lower chest: The lung bases are clear. Hepatobiliary: No focal liver abnormality is seen. No gallstones, gallbladder wall thickening, or biliary dilatation. Pancreas: No ductal dilatation or inflammation. Spleen: Normal in size without focal abnormality. Adrenals/Urinary Tract: Adrenal glands are unremarkable. Kidneys are normal, without renal calculi, focal lesion, or  hydronephrosis. Bladder is unremarkable. Stomach/Bowel: The appendix is dilated measuring 13 mm with wall thickening, peripheral enhancement sign intraluminal fluid. Moderate surrounding periappendiceal soft tissue stranding and mild free fluid. No perforation or abscess. Minimal wall thickening of pelvic bowel loops likely reactive. Stomach is physiologically distended. Vascular/Lymphatic: No significant vascular findings are present. No enlarged abdominal or pelvic lymph nodes. Reproductive: Prostate is unremarkable. Other: Small-moderate volume free fluid in the pelvis is minimally complex, likely reactive. No loculated abscess. Musculoskeletal: There are no acute or suspicious osseous abnormalities. IMPRESSION: Uncomplicated acute appendicitis. Small to moderate free fluid in the pelvis is likely reactive. No loculated abscess or perforation. Electronically Signed   By: Rubye OaksMelanie  Ehinger M.D.   On: 03/11/2017 05:28    Procedures Procedures    Medications Ordered in ED Medications  cefTRIAXone (ROCEPHIN) 2 g in dextrose 5 % 50 mL IVPB (not administered)    And  metroNIDAZOLE (FLAGYL) IVPB 500 mg (not administered)  fentaNYL (SUBLIMAZE) injection 50 mcg (50 mcg Intravenous Given 03/11/17 0415)  ondansetron (ZOFRAN) injection 4 mg (4 mg Intravenous Given 03/11/17 0415)  iopamidol (ISOVUE-300) 61 % injection 100 mL (100 mLs Intravenous Contrast Given 03/11/17 0511)     Initial Impression / Assessment and Plan / ED Course  I have reviewed the triage vital signs and the nursing notes.  Pertinent labs results that were available during my care of the patient were reviewed by me and considered in my medical decision making (see chart for details).     4:16 AM Pt stable He will need CT to evaluate for appendicitis  5:55 AM Ct reveals uncomplicated appendicitis D/w dr Johna Sheriffhoxworth Surgery team to see patient in approximately one hour Start rocephin/flagyll Patient/family updated on plan  Final  Clinical Impressions(s) / ED Diagnoses   Final diagnoses:  Acute appendicitis with localized peritonitis    New Prescriptions New Prescriptions   No medications on file     Zadie RhineWickline, Biance Moncrief, MD 03/11/17 [phone]

## 2017-03-11 NOTE — Progress Notes (Signed)
Pt doing well, thinks he wants to go home after supper.    Will get him ready to go.    I have personally reviewed the patients medication history on the [place] controlled substance database.  Agree with above.  Ovidio Kinavid Galena Logie, MD, [facility] Pager: [phone] Office phone:  [phone]

## 2017-03-11 NOTE — Anesthesia Procedure Notes (Signed)
Procedure Name: Intubation Date/Time: 03/11/2017 9:36 AM Performed by: Jhonnie GarnerMARSHALL, Deajah Erkkila M Pre-anesthesia Checklist: Patient identified, Emergency Drugs available, Suction available and Patient being monitored Patient Re-evaluated:Patient Re-evaluated prior to inductionOxygen Delivery Method: Circle system utilized Preoxygenation: Pre-oxygenation with 100% oxygen Intubation Type: IV induction Ventilation: Mask ventilation without difficulty Laryngoscope Size: Miller and 2 Grade View: Grade I Tube type: Oral Tube size: 7.5 mm Number of attempts: 1 Airway Equipment and Method: Stylet Placement Confirmation: ETT inserted through vocal cords under direct vision,  positive ETCO2 and breath sounds checked- equal and bilateral Secured at: 23 cm Tube secured with: Tape Dental Injury: Teeth and Oropharynx as per pre-operative assessment

## 2017-03-11 NOTE — ED Triage Notes (Signed)
Pt complains of right lower abdominal pain since last night Denies vomiting or diarrhea

## 2017-03-11 NOTE — ED Notes (Signed)
Patient report was given to OR RN. Patient was taken to OR.

## 2017-03-11 NOTE — Progress Notes (Signed)
Pt ambulated in hall without difficulty. Minor complaints of pain. No nausea or vomiting. Tolerating diet. Will discharge patient as ordered.

## 2017-03-12 ENCOUNTER — Encounter (HOSPITAL_COMMUNITY): Payer: Self-pay | Admitting: Surgery

## 2017-03-17 NOTE — Discharge Summary (Signed)
Physician Discharge Summary  Patient ID: Mark Roth MRN: [id number] DOB/AGE: August 23, 1996 21 y.o.  Admit date: 03/11/2017 Discharge date: 03/11/2017  Admission Diagnoses:  Acute purulent appnedicitis  Discharge Diagnoses:  Same  Active Problems:   Acute appendicitis   PROCEDURES: Laparoscopic appendectomy 03/11/18, Dr. Marty Heck Course:  Patient is a 21 y.o. Male who presented to [facility] with lower abdominal pain since Sunday night. Pain has been constant and stabbing in nature, localized to the RLQ yesterday. Associated nausea. Denies vomiting, diarrhea, fever, difficulty urinating. Pain is exacerbated with movement, nothing alleviates. No significant PMH, takes no daily medications. Patient has not had any past surgeries. Denies tobacco use, alcohol use, any illicit drug use.  Pt was seen in the ED at 7AM and admitted.  He was taken to the OR later that AM.  Post op he did well.  It was Dr. Allene Pyo opionion he could go home in the PM of the day of surgery.  We checked on him around 5 PM.  He was able to eat, ambulate without difficulty and pain was controlled by Oral pain meds.  He ws discharged home later that evening around 9 PM.   Condition on d/c:  Improved.    CBC Latest Ref Rng & Units 03/11/2017  WBC 4.0 - 10.5 K/uL 12.2(H)  Hemoglobin 13.0 - 17.0 g/dL 25.3  Hematocrit 66.4 - 52.0 % 45.1  Platelets 150 - 400 K/uL 246   CMP Latest Ref Rng & Units 03/11/2017  Glucose 65 - 99 mg/dL 98  BUN 6 - 20 mg/dL 11  Creatinine 4.03 - 4.74 mg/dL 2.59  Sodium 563 - 875 mmol/L 136  Potassium 3.5 - 5.1 mmol/L 3.9  Chloride 101 - 111 mmol/L 102  CO2 22 - 32 mmol/L 24  Calcium 8.9 - 10.3 mg/dL 9.6  Total Protein 6.5 - 8.1 g/dL 7.8  Total Bilirubin 0.3 - 1.2 mg/dL 1.1  Alkaline Phos 38 - 126 U/L 85  AST 15 - 41 U/L 24  ALT 17 - 63 U/L 18    Appendix, Other than Incidental APPENDICITIS WITH PERI APPENDICEAL ABSCESS NEGATIVE FOR MALIGNANCY  Disposition: 01-Home or Self  Care   Allergies as of 03/11/2017   No Known Allergies     Medication List    STOP taking these medications   sodium phosphate Pediatric 3.5-9.5 GM/59ML enema     TAKE these medications   acetaminophen 325 MG tablet Commonly known as:  TYLENOL You can alternate this with the Ibuprofen or the prescribed pain pill.  Tylenol(acetaminophen) is in your prescribed pain medicine.  Do not take more than 4000 mg of Tylenol(acetaminophen) per day.  Follow package instructions.   HYDROcodone-acetaminophen 5-325 MG tablet Commonly known as:  NORCO/VICODIN Take 1-2 tablets by mouth every 4 (four) hours as needed for moderate pain.   ibuprofen 200 MG tablet Commonly known as:  ADVIL,MOTRIN Take 400 mg by mouth every 6 (six) hours as needed for headache, mild pain or moderate pain.      Follow-up Information    [facility] Follow up.   Specialty:  General Surgery Why:  Our office should call you tomorrow with follow up instructions.  If they do not call on Wed and ask for "DOW," appointment in 2-3 weeks. Contact information: [address] [place] [address] [phone]           Signed: Sherrie George 03/17/2017, 11:46 AM   Agree with above.  Ovidio Kin, MD, [facility]  Surgery Pager: [phone] Office phone:  [phone]

## 2017-10-26 IMAGING — CT CT ABD-PELV W/ CM
2 of 4 series · 16 of 46 positions shown, 18 images · IV contrast (agent unspecified)
Comparison: None.

CLINICAL DATA: Right lower quadrant pain for 1 day.

EXAM:
CT ABDOMEN AND PELVIS WITH CONTRAST
TECHNIQUE: Multidetector CT imaging of the abdomen and pelvis was performed
using the standard protocol following bolus administration of
intravenous contrast.
CONTRAST:  100 cc 7sovue-XPP IV

[Series 2: abd/pel with · axial · 0.65mm/px · z∈[+1162,+1548]mm · 13 of 87 slices shown, 15 images]
[im 5/87  soft-tissue]
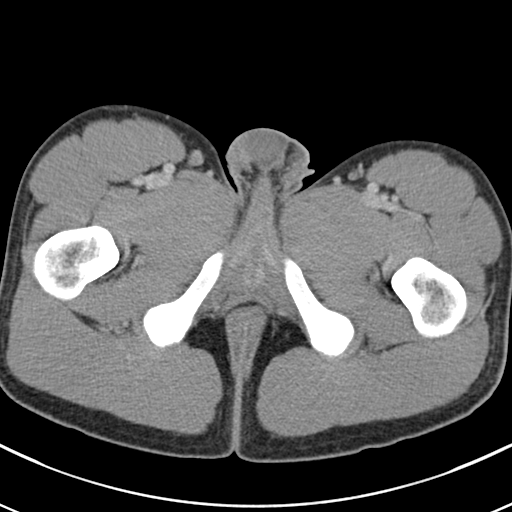
[im 5/87  bone]
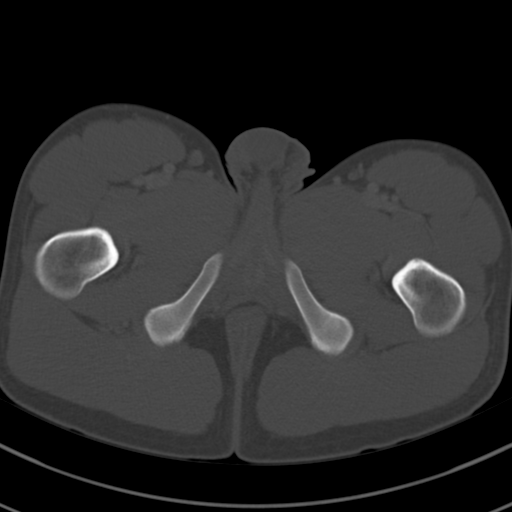
[im 13/87  soft-tissue]
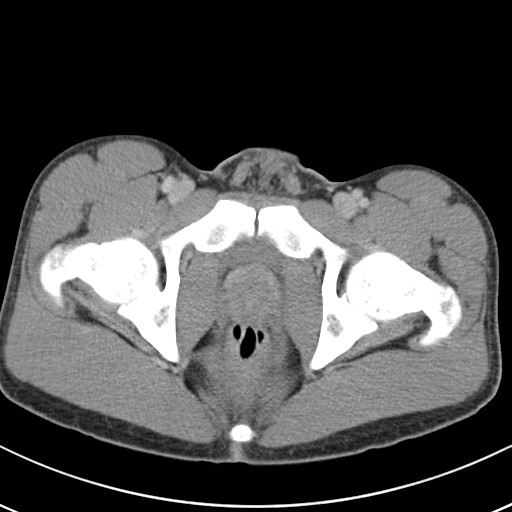
[im 17/87  soft-tissue]
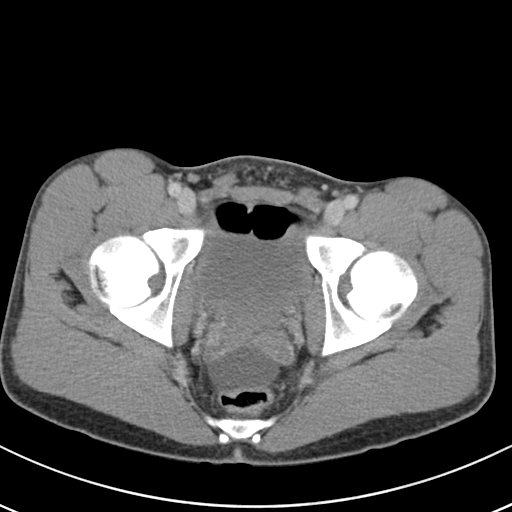
[im 25/87  soft-tissue]
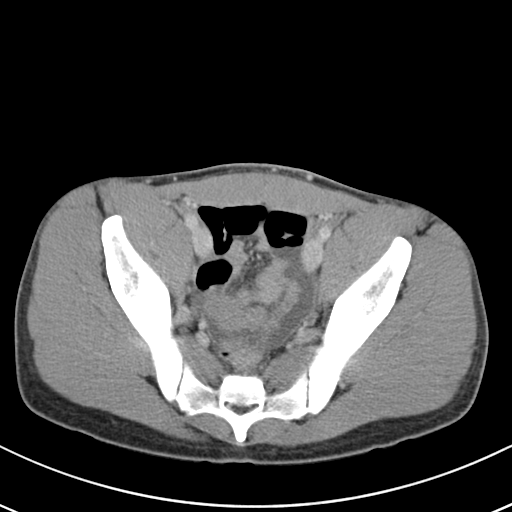
[im 29/87  soft-tissue]
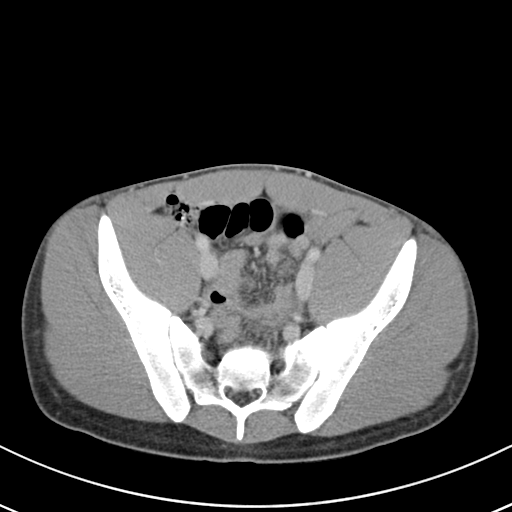
[im 37/87  soft-tissue]
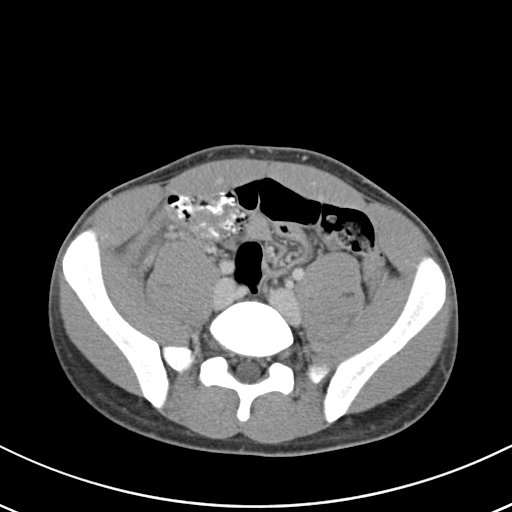
[im 46/87  soft-tissue]
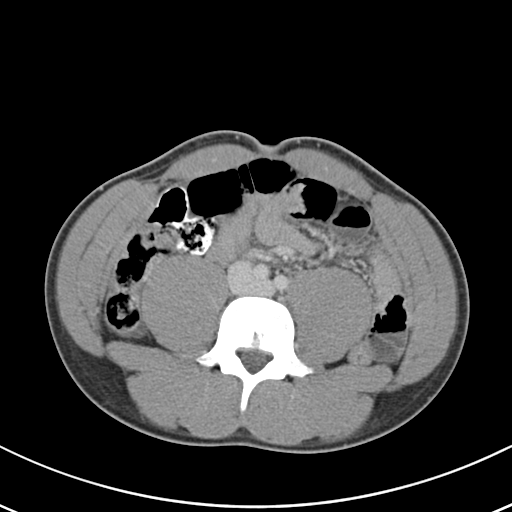
[im 50/87  soft-tissue]
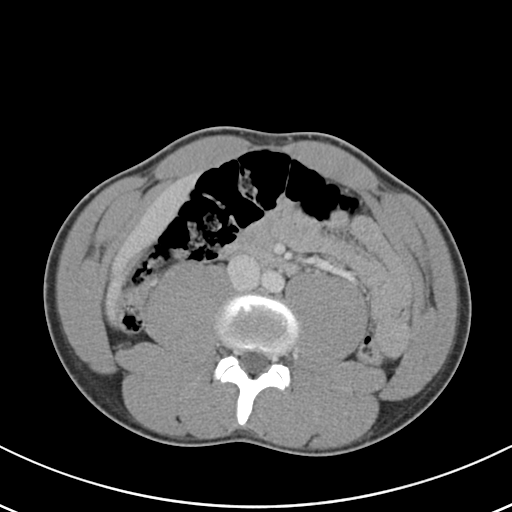
[im 58/87  soft-tissue]
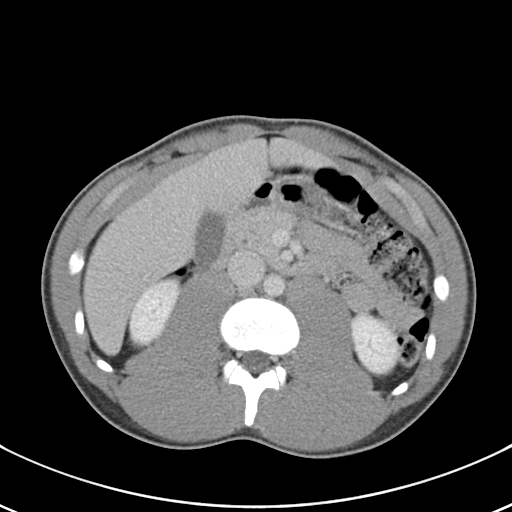
[im 58/87  bone]
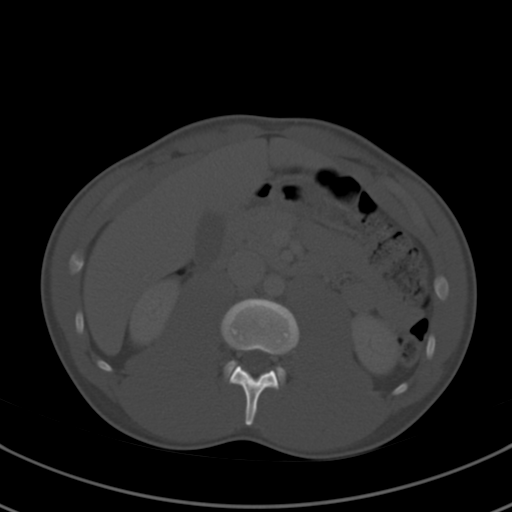
[im 62/87  soft-tissue]
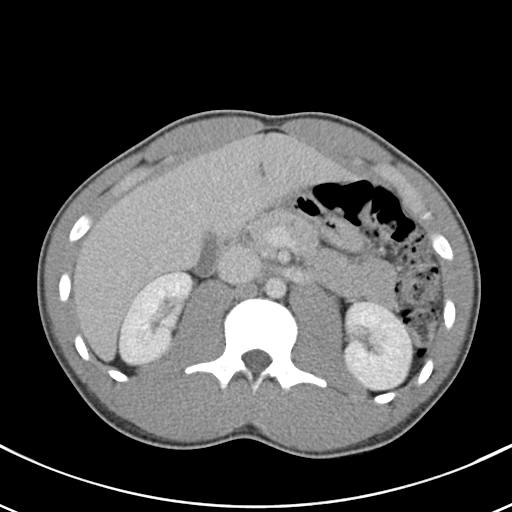
[im 70/87  soft-tissue]
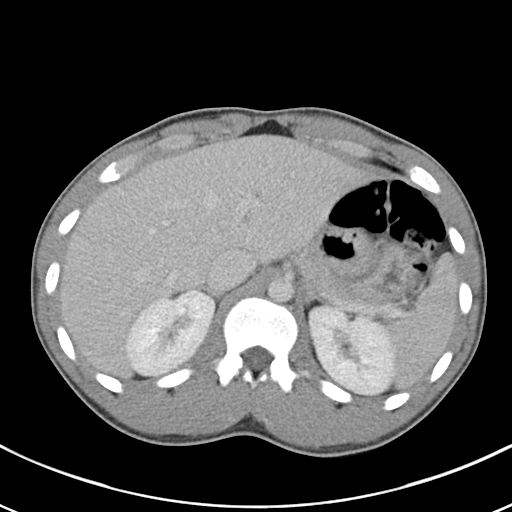
[im 74/87  soft-tissue]
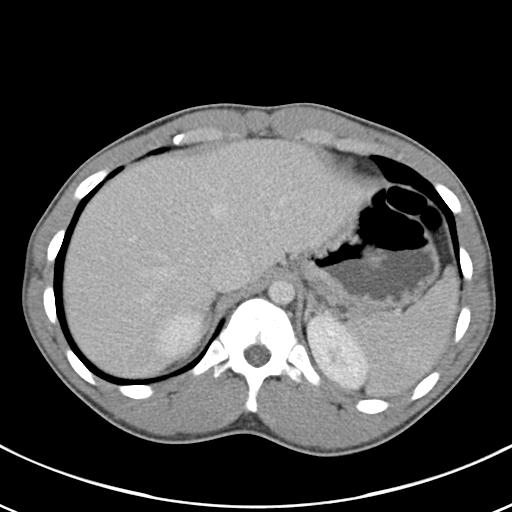
[im 82/87  soft-tissue]
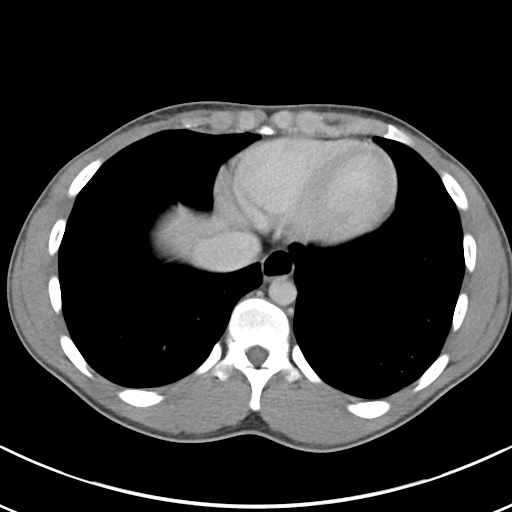

[Series 4: coronal a/|p · coronal · 0.62mm/px · 3 of 114 slices shown]
[im 38/114  soft-tissue]
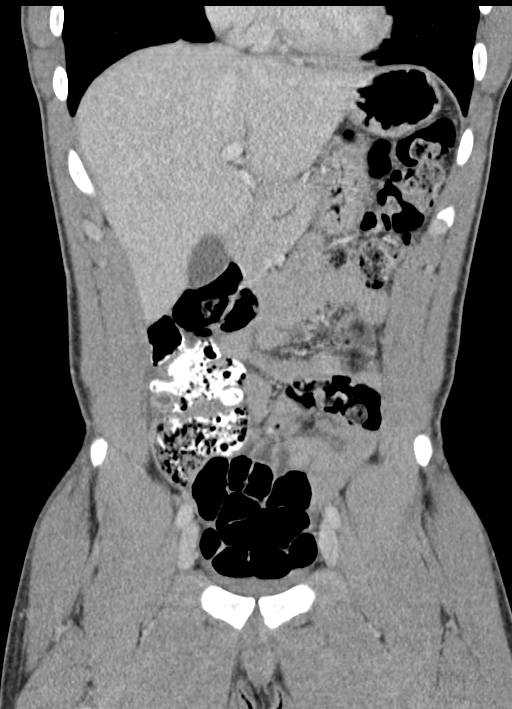
[im 51/114  soft-tissue]
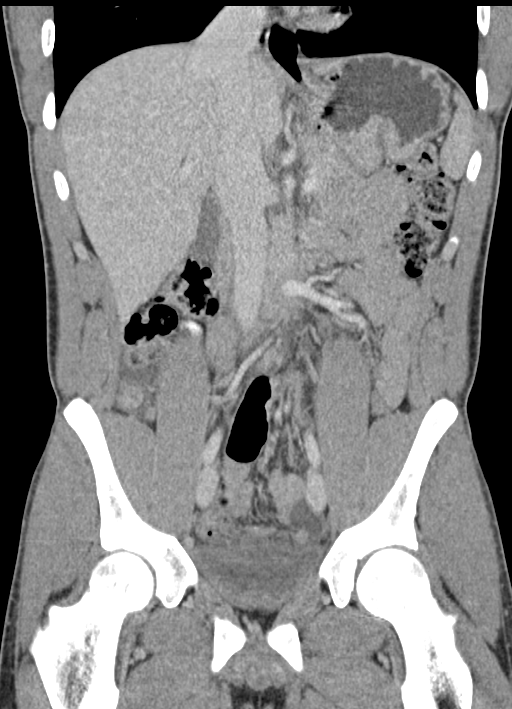
[im 63/114  soft-tissue]
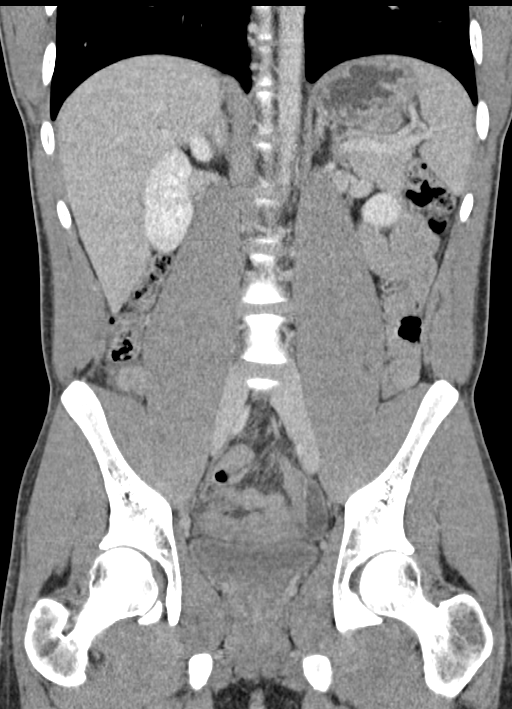

[16 of 46 positions shown; findings below may reference images not displayed]

FINDINGS: Lower chest: The lung bases are clear.

Hepatobiliary: No focal liver abnormality is seen. No gallstones,
gallbladder wall thickening, or biliary dilatation.

Pancreas: No ductal dilatation or inflammation.

Spleen: Normal in size without focal abnormality.

Adrenals/Urinary Tract: Adrenal glands are unremarkable. Kidneys are
normal, without renal calculi, focal lesion, or hydronephrosis.
Bladder is unremarkable.

Stomach/Bowel: The appendix is dilated measuring 13 mm with wall
thickening, peripheral enhancement sign intraluminal fluid. Moderate
surrounding periappendiceal soft tissue stranding and mild free
fluid. No perforation or abscess. Minimal wall thickening of pelvic
bowel loops likely reactive. Stomach is physiologically distended.

Vascular/Lymphatic: No significant vascular findings are present. No
enlarged abdominal or pelvic lymph nodes.

Reproductive: Prostate is unremarkable.

Other: Small-moderate volume free fluid in the pelvis is minimally
complex, likely reactive. No loculated abscess.

Musculoskeletal: There are no acute or suspicious osseous
abnormalities.
IMPRESSION: Uncomplicated acute appendicitis. Small to moderate free fluid in
the pelvis is likely reactive. No loculated abscess or perforation.

## 2021-09-03 ENCOUNTER — Emergency Department (HOSPITAL_COMMUNITY)
Admission: EM | Admit: 2021-09-03 | Discharge: 2021-09-03 | Disposition: A | Discharge status: Home / Self Care | Payer: Self-pay | Attending: Emergency Medicine | Admitting: Emergency Medicine

## 2021-09-03 ENCOUNTER — Other Ambulatory Visit: Payer: Self-pay

## 2021-09-03 ENCOUNTER — Encounter (HOSPITAL_COMMUNITY): Payer: Self-pay

## 2021-09-03 DIAGNOSIS — H6501 Acute serous otitis media, right ear: Secondary | ICD-10-CM | POA: Insufficient documentation

## 2021-09-03 DIAGNOSIS — H6591 Unspecified nonsuppurative otitis media, right ear: Secondary | ICD-10-CM

## 2021-09-03 MED ORDER — PREDNISONE 10 MG (21) PO TBPK: ORAL_TABLET | ORAL | 0 refills | Status: AC

## 2021-09-03 NOTE — Discharge Instructions (Addendum)
Contact a health care provider if: You have pain that gets worse. Hearing in your affected ear gets worse. You have fluid draining from your ear canal. You have dizziness. You develop a fever. Get help right away if: You develop a severe headache. You completely lose hearing in the affected ear. You have bleeding from your ear canal. You have sudden and severe pain in your ear. 

## 2021-09-03 NOTE — ED Triage Notes (Signed)
Pt reports having an issue with his hearing in his right ear for a few years, but reports it has become worse. He now endorses right ear, face, and head pain.

## 2021-09-03 NOTE — ED Provider Notes (Signed)
[facility]-EMERGENCY DEPT Provider Note   CSN: [id number] Arrival date & time: 09/03/21  1507     History Chief Complaint  Patient presents with   Facial Pain   Hearing Problem    Mark Roth is a 25 y.o. male who presents emergency department with chief complaint of hearing changes and facial pain.  Patient states that he has had problems with his right ear including bilateral cerumen impactions in the past.  He complains that at times he hears abnormal sounds or flickering in his right ear, sometimes he has diminished hearing.  Seems to be worse when he has a head cold or worsened by loud music.  This has been going on for some time but he has felt it more so recently due to recent head cold.  Patient also complains of pain on the right side of his face that started yesterday.  He describes it as very mild, dull, aching completely relieved by Tylenol earlier today.  He feels it in his teeth but denies any actual tooth pain.  He denies any epistaxis, runny nose.  Denies fevers or chills  HPI     History reviewed. No pertinent past medical history.  Patient Active Problem List   Diagnosis Date Noted   Acute appendicitis 03/11/2017    Past Surgical History:  Procedure Laterality Date   LAPAROSCOPIC APPENDECTOMY N/A 03/11/2017   Procedure: APPENDECTOMY LAPAROSCOPIC;  Surgeon: Ovidio Kin, MD;  Location: [facility];  Service: General;  Laterality: N/A;       History reviewed. No pertinent family history.  Social History   Tobacco Use   Smoking status: Never   Smokeless tobacco: Never  Substance Use Topics   Alcohol use: No   Drug use: No    Home Medications Prior to Admission medications   Medication Sig Start Date End Date Taking? Authorizing Provider  predniSONE (STERAPRED UNI-PAK 21 TAB) 10 MG (21) TBPK tablet Use as directed 09/03/21  Yes Terrica Duecker, PA-C  acetaminophen (TYLENOL) 325 MG tablet You can alternate this with the Ibuprofen or  the prescribed pain pill.  Tylenol(acetaminophen) is in your prescribed pain medicine.  Do not take more than 4000 mg of Tylenol(acetaminophen) per day.  Follow package instructions. 03/11/17   Sherrie George, PA-C  HYDROcodone-acetaminophen (NORCO/VICODIN) 5-325 MG tablet Take 1-2 tablets by mouth every 4 (four) hours as needed for moderate pain. 03/11/17   Sherrie George, PA-C  ibuprofen (ADVIL,MOTRIN) 200 MG tablet Take 400 mg by mouth every 6 (six) hours as needed for headache, mild pain or moderate pain.    [provider]    Allergies    Patient has no known allergies.  Review of Systems   Review of Systems Ten systems reviewed and are negative for acute change, except as noted in the HPI.   Physical Exam Updated Vital Signs BP 94/65   Pulse 82   Temp 98.9 F (37.2 C) (Oral)   Resp 18   Ht 6' (1.829 m)   Wt 79.4 kg   SpO2 100%   BMI 23.73 kg/m   Physical Exam Vitals and nursing note reviewed.  Constitutional:      General: He is not in acute distress.    Appearance: He is well-developed. He is not diaphoretic.  HENT:     Head: Normocephalic and atraumatic.     Right Ear: A middle ear effusion is present. No mastoid tenderness.     Left Ear: Hearing, tympanic membrane, ear canal and external  ear normal.     Mouth/Throat:     Mouth: Mucous membranes are moist.     Dentition: Normal dentition. No dental tenderness, dental caries or dental abscesses.  Eyes:     General: No scleral icterus.    Conjunctiva/sclera: Conjunctivae normal.  Cardiovascular:     Rate and Rhythm: Normal rate and regular rhythm.     Heart sounds: Normal heart sounds.  Pulmonary:     Effort: Pulmonary effort is normal. No respiratory distress.     Breath sounds: Normal breath sounds.  Abdominal:     Palpations: Abdomen is soft.     Tenderness: There is no abdominal tenderness.  Musculoskeletal:     Cervical back: Normal range of motion and neck supple.  Skin:    General: Skin  is warm and dry.     Findings: No rash.  Neurological:     Mental Status: He is alert.  Psychiatric:        Behavior: Behavior normal.    ED Results / Procedures / Treatments   Labs (all labs ordered are listed, but only abnormal results are displayed) Labs Reviewed - No data to display  EKG None  Radiology No results found.  Procedures Procedures   Medications Ordered in ED Medications - No data to display  ED Course  I have reviewed the triage vital signs and the nursing notes.  Pertinent labs & imaging results that were available during my care of the patient were reviewed by me and considered in my medical decision making (see chart for details).    MDM Rules/Calculators/A&P                          Patient here with complaint of facial pain and abnormal hearing on the right.  Appears he has a large effusion on the right middle ear.  No evidence of infection.  Suspect that his discomfort is all related to head and facial congestion.  Dentition are intact without evidence of infection.  No obvious dental caries. Patient has no obvious vesicles or signs of developing zoster infection.  Has facial pain is not in the distribution of the facial nerve so I have low suspicion for trigeminal neuralgia.  Patient will be discharged with Tylenol, prednisone, over-the-counter decongestions.  Discussed outpatient follow-up and return precautions. Final Clinical Impression(s) / ED Diagnoses Final diagnoses:  OME (otitis media with effusion), right    Rx / DC Orders ED Discharge Orders          Ordered    predniSONE (STERAPRED UNI-PAK 21 TAB) 10 MG (21) TBPK tablet        09/03/21 1643             Arthor Captain, PA-C 09/03/21 1712    Derwood Kaplan, MD 09/04/21 2107
